# Patient Record
Sex: Male | Born: 1985 | Race: White | Hispanic: No | Marital: Single | Smoking: Current every day smoker
Health system: Southern US, Community
[De-identification: ages and names within clinical notes are randomized; demographics above are authoritative.]

---
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
## 2003-11-21 ENCOUNTER — Emergency Department (HOSPITAL_COMMUNITY): Admission: EM | Admit: 2003-11-21 | Discharge: 2003-11-21 | Payer: Self-pay | Admitting: Emergency Medicine

## 2007-01-06 ENCOUNTER — Emergency Department: Payer: Self-pay | Admitting: Emergency Medicine

## 2016-10-30 ENCOUNTER — Emergency Department (HOSPITAL_COMMUNITY)
Admission: EM | Admit: 2016-10-30 | Discharge: 2016-10-30 | Disposition: A | Discharge status: Home / Self Care | Payer: No Typology Code available for payment source | Attending: Emergency Medicine | Admitting: Emergency Medicine

## 2016-10-30 ENCOUNTER — Emergency Department (HOSPITAL_COMMUNITY): Payer: No Typology Code available for payment source

## 2016-10-30 ENCOUNTER — Encounter (HOSPITAL_COMMUNITY): Payer: Self-pay

## 2016-10-30 DIAGNOSIS — Y9389 Activity, other specified: Secondary | ICD-10-CM | POA: Diagnosis not present

## 2016-10-30 DIAGNOSIS — Y999 Unspecified external cause status: Secondary | ICD-10-CM | POA: Insufficient documentation

## 2016-10-30 DIAGNOSIS — Z23 Encounter for immunization: Secondary | ICD-10-CM | POA: Diagnosis not present

## 2016-10-30 DIAGNOSIS — Y92410 Unspecified street and highway as the place of occurrence of the external cause: Secondary | ICD-10-CM | POA: Insufficient documentation

## 2016-10-30 DIAGNOSIS — R55 Syncope and collapse: Secondary | ICD-10-CM | POA: Insufficient documentation

## 2016-10-30 DIAGNOSIS — S62612A Displaced fracture of proximal phalanx of right middle finger, initial encounter for closed fracture: Secondary | ICD-10-CM | POA: Diagnosis not present

## 2016-10-30 DIAGNOSIS — F17200 Nicotine dependence, unspecified, uncomplicated: Secondary | ICD-10-CM | POA: Diagnosis not present

## 2016-10-30 DIAGNOSIS — S0181XA Laceration without foreign body of other part of head, initial encounter: Secondary | ICD-10-CM | POA: Insufficient documentation

## 2016-10-30 DIAGNOSIS — S6991XA Unspecified injury of right wrist, hand and finger(s), initial encounter: Secondary | ICD-10-CM | POA: Diagnosis present

## 2016-10-30 LAB — COMPREHENSIVE METABOLIC PANEL
ALBUMIN: 4 g/dL (ref 3.5–5.0)
ALK PHOS: 59 U/L (ref 38–126)
ALT: 18 U/L (ref 17–63)
ANION GAP: 8 (ref 5–15)
AST: 28 U/L (ref 15–41)
BILIRUBIN TOTAL: 0.6 mg/dL (ref 0.3–1.2)
BUN: 7 mg/dL (ref 6–20)
CALCIUM: 9.2 mg/dL (ref 8.9–10.3)
CO2: 27 mmol/L (ref 22–32)
Chloride: 102 mmol/L (ref 101–111)
Creatinine, Ser: 0.64 mg/dL (ref 0.61–1.24)
GFR calc Af Amer: >60 mL/min (ref >60)
GFR calc non Af Amer: >60 mL/min (ref >60)
GLUCOSE: 111 mg/dL — AB (ref 65–99)
Potassium: 3.7 mmol/L (ref 3.5–5.1)
SODIUM: 137 mmol/L (ref 135–145)
Total Protein: 6.5 g/dL (ref 6.5–8.1)

## 2016-10-30 LAB — CBC WITH DIFFERENTIAL/PLATELET
BASOS ABS: 0 10*3/uL (ref 0.0–0.1)
BASOS PCT: 0 %
EOS ABS: 0.2 10*3/uL (ref 0.0–0.7)
Eosinophils Relative: 2 %
HEMATOCRIT: 39.8 % (ref 39.0–52.0)
HEMOGLOBIN: 13.9 g/dL (ref 13.0–17.0)
Lymphocytes Relative: 11 %
Lymphs Abs: 1.5 10*3/uL (ref 0.7–4.0)
MCH: 30.8 pg (ref 26.0–34.0)
MCHC: 34.9 g/dL (ref 30.0–36.0)
MCV: 88.1 fL (ref 78.0–100.0)
Monocytes Absolute: 1.1 10*3/uL — ABNORMAL HIGH (ref 0.1–1.0)
Monocytes Relative: 8 %
NEUTROS ABS: 11 10*3/uL — AB (ref 1.7–7.7)
NEUTROS PCT: 79 %
Platelets: 218 10*3/uL (ref 150–400)
RBC: 4.52 MIL/uL (ref 4.22–5.81)
RDW: 12.6 % (ref 11.5–15.5)
WBC: 13.8 10*3/uL — AB (ref 4.0–10.5)

## 2016-10-30 LAB — ETHANOL: Alcohol, Ethyl (B): <5 mg/dL (ref <5)

## 2016-10-30 MED ORDER — LIDOCAINE-EPINEPHRINE (PF) 2 %-1:200000 IJ SOLN
20.0000 mL | Freq: Once | INTRAMUSCULAR | Status: AC
  Administered 2016-10-30: 20 mL via INTRADERMAL
  Filled 2016-10-30: qty 20

## 2016-10-30 MED ORDER — SODIUM CHLORIDE 0.9 % IV BOLUS (SEPSIS)
1000.0000 mL | Freq: Once | INTRAVENOUS | Status: AC
  Administered 2016-10-30: 1000 mL via INTRAVENOUS

## 2016-10-30 MED ORDER — TETANUS-DIPHTH-ACELL PERTUSSIS 5-2.5-18.5 LF-MCG/0.5 IM SUSP
0.5000 mL | Freq: Once | INTRAMUSCULAR | Status: AC
  Administered 2016-10-30: 0.5 mL via INTRAMUSCULAR
  Filled 2016-10-30: qty 0.5

## 2016-10-30 NOTE — ED Notes (Signed)
Pt more awake at this time. Able to ambulate to restroom. Has phoned a friend to transport home.
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
## 2016-10-30 NOTE — Progress Notes (Signed)
Orthopedic Tech Progress Note Patient Details:  Steven Dennis 1986/06/19 [id number]  Ortho Devices Type of Ortho Device: Finger splint Ortho Device/Splint Location: applied finger splint to pt right hand middle finger.  pt tolerated well.  Right hand Middle finger.  Ortho Device/Splint Interventions: Application   Alvina Chou 10/30/2016, 4:22 PM

## 2016-10-30 NOTE — ED Notes (Signed)
Pt voices understanding of discharge instructions. Ambulatory to waiting room to wait for transportation.
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
## 2016-10-30 NOTE — Discharge Instructions (Signed)
Please return with concerns./

## 2016-10-31 ENCOUNTER — Telehealth: Payer: Self-pay | Admitting: *Deleted
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
## 2023-03-11 ENCOUNTER — Other Ambulatory Visit: Payer: Self-pay

## 2023-03-11 ENCOUNTER — Encounter (HOSPITAL_COMMUNITY): Payer: Self-pay

## 2023-03-11 ENCOUNTER — Emergency Department (HOSPITAL_COMMUNITY): Payer: Managed Care, Other (non HMO)

## 2023-03-11 ENCOUNTER — Inpatient Hospital Stay (HOSPITAL_COMMUNITY)
Admission: EM | Admit: 2023-03-11 | Discharge: 2023-03-12 | DRG: 563 | Disposition: A | Discharge status: Home / Self Care | Payer: Managed Care, Other (non HMO) | Attending: General Surgery | Admitting: General Surgery

## 2023-03-11 DIAGNOSIS — F17200 Nicotine dependence, unspecified, uncomplicated: Secondary | ICD-10-CM | POA: Diagnosis present

## 2023-03-11 DIAGNOSIS — S40811A Abrasion of right upper arm, initial encounter: Secondary | ICD-10-CM | POA: Diagnosis present

## 2023-03-11 DIAGNOSIS — S0083XA Contusion of other part of head, initial encounter: Secondary | ICD-10-CM | POA: Diagnosis present

## 2023-03-11 DIAGNOSIS — S069X0A Unspecified intracranial injury without loss of consciousness, initial encounter: Secondary | ICD-10-CM

## 2023-03-11 DIAGNOSIS — Y902 Blood alcohol level of 40-59 mg/100 ml: Secondary | ICD-10-CM | POA: Diagnosis present

## 2023-03-11 DIAGNOSIS — S50311A Abrasion of right elbow, initial encounter: Secondary | ICD-10-CM | POA: Diagnosis present

## 2023-03-11 DIAGNOSIS — S069XAA Unspecified intracranial injury with loss of consciousness status unknown, initial encounter: Secondary | ICD-10-CM | POA: Diagnosis present

## 2023-03-11 DIAGNOSIS — F10129 Alcohol abuse with intoxication, unspecified: Secondary | ICD-10-CM | POA: Diagnosis present

## 2023-03-11 DIAGNOSIS — Z653 Problems related to other legal circumstances: Secondary | ICD-10-CM

## 2023-03-11 DIAGNOSIS — S92341A Displaced fracture of fourth metatarsal bone, right foot, initial encounter for closed fracture: Secondary | ICD-10-CM | POA: Diagnosis present

## 2023-03-11 DIAGNOSIS — S060XAA Concussion with loss of consciousness status unknown, initial encounter: Principal | ICD-10-CM | POA: Diagnosis present

## 2023-03-11 DIAGNOSIS — S92301A Fracture of unspecified metatarsal bone(s), right foot, initial encounter for closed fracture: Secondary | ICD-10-CM

## 2023-03-11 DIAGNOSIS — D72829 Elevated white blood cell count, unspecified: Secondary | ICD-10-CM | POA: Diagnosis present

## 2023-03-11 DIAGNOSIS — Y92410 Unspecified street and highway as the place of occurrence of the external cause: Secondary | ICD-10-CM

## 2023-03-11 DIAGNOSIS — S0003XA Contusion of scalp, initial encounter: Secondary | ICD-10-CM | POA: Diagnosis present

## 2023-03-11 DIAGNOSIS — S92331A Displaced fracture of third metatarsal bone, right foot, initial encounter for closed fracture: Secondary | ICD-10-CM | POA: Diagnosis present

## 2023-03-11 DIAGNOSIS — M79651 Pain in right thigh: Secondary | ICD-10-CM | POA: Diagnosis present

## 2023-03-11 DIAGNOSIS — S60511A Abrasion of right hand, initial encounter: Secondary | ICD-10-CM | POA: Diagnosis present

## 2023-03-11 DIAGNOSIS — S92321A Displaced fracture of second metatarsal bone, right foot, initial encounter for closed fracture: Principal | ICD-10-CM | POA: Diagnosis present

## 2023-03-11 MED ORDER — SODIUM CHLORIDE 0.9 % IV SOLN: 250.0000 mL | INTRAVENOUS | Status: DC | PRN

## 2023-03-11 MED ORDER — SODIUM CHLORIDE 0.9% FLUSH: 3.0000 mL | INTRAVENOUS | Status: DC | PRN

## 2023-03-11 MED ORDER — SODIUM CHLORIDE 0.9% FLUSH
3.0000 mL | Freq: Two times a day (BID) | INTRAVENOUS | Status: DC
  Administered 2023-03-12: 3 mL via INTRAVENOUS

## 2023-03-11 NOTE — ED Notes (Signed)
To CT
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
## 2023-03-12 ENCOUNTER — Emergency Department (HOSPITAL_COMMUNITY): Payer: Managed Care, Other (non HMO)

## 2023-03-12 ENCOUNTER — Inpatient Hospital Stay (HOSPITAL_COMMUNITY): Payer: Managed Care, Other (non HMO)

## 2023-03-12 DIAGNOSIS — S069XAA Unspecified intracranial injury with loss of consciousness status unknown, initial encounter: Secondary | ICD-10-CM | POA: Diagnosis present

## 2023-03-12 LAB — URINALYSIS, ROUTINE W REFLEX MICROSCOPIC
Bacteria, UA: NONE SEEN
Bilirubin Urine: NEGATIVE
Glucose, UA: NEGATIVE mg/dL
Ketones, ur: NEGATIVE mg/dL
Leukocytes,Ua: NEGATIVE
Nitrite: NEGATIVE
Protein, ur: NEGATIVE mg/dL
Specific Gravity, Urine: 1.044 — ABNORMAL HIGH (ref 1.005–1.030)
pH: 5 (ref 5.0–8.0)

## 2023-03-12 LAB — COMPREHENSIVE METABOLIC PANEL
ALT: 24 U/L (ref 0–44)
AST: 42 U/L — ABNORMAL HIGH (ref 15–41)
Albumin: 4 g/dL (ref 3.5–5.0)
Alkaline Phosphatase: 59 U/L (ref 38–126)
Anion gap: 11 (ref 5–15)
BUN: 12 mg/dL (ref 6–20)
CO2: 23 mmol/L (ref 22–32)
Calcium: 8.8 mg/dL — ABNORMAL LOW (ref 8.9–10.3)
Chloride: 102 mmol/L (ref 98–111)
Creatinine, Ser: 0.78 mg/dL (ref 0.61–1.24)
GFR, Estimated: >60 mL/min (ref >60)
Glucose, Bld: 105 mg/dL — ABNORMAL HIGH (ref 70–99)
Potassium: 3.4 mmol/L — ABNORMAL LOW (ref 3.5–5.1)
Sodium: 136 mmol/L (ref 135–145)
Total Bilirubin: 0.8 mg/dL (ref 0.3–1.2)
Total Protein: 6.7 g/dL (ref 6.5–8.1)

## 2023-03-12 LAB — I-STAT CHEM 8, ED
BUN: 11 mg/dL (ref 6–20)
Calcium, Ion: 1.14 mmol/L — ABNORMAL LOW (ref 1.15–1.40)
Chloride: 101 mmol/L (ref 98–111)
Creatinine, Ser: 0.6 mg/dL — ABNORMAL LOW (ref 0.61–1.24)
Glucose, Bld: 98 mg/dL (ref 70–99)
HCT: 39 % (ref 39.0–52.0)
Hemoglobin: 13.3 g/dL (ref 13.0–17.0)
Potassium: 3.4 mmol/L — ABNORMAL LOW (ref 3.5–5.1)
Sodium: 136 mmol/L (ref 135–145)
TCO2: 23 mmol/L (ref 22–32)

## 2023-03-12 LAB — CBC
HCT: 41 % (ref 39.0–52.0)
Hemoglobin: 13.9 g/dL (ref 13.0–17.0)
MCH: 30.4 pg (ref 26.0–34.0)
MCHC: 33.9 g/dL (ref 30.0–36.0)
MCV: 89.7 fL (ref 80.0–100.0)
Platelets: 252 10*3/uL (ref 150–400)
RBC: 4.57 MIL/uL (ref 4.22–5.81)
RDW: 12.3 % (ref 11.5–15.5)
WBC: 14.9 10*3/uL — ABNORMAL HIGH (ref 4.0–10.5)
nRBC: 0 % (ref 0.0–0.2)

## 2023-03-12 LAB — RAPID URINE DRUG SCREEN, HOSP PERFORMED
Amphetamines: POSITIVE — AB
Barbiturates: NOT DETECTED
Benzodiazepines: NOT DETECTED
Cocaine: NOT DETECTED
Opiates: NOT DETECTED
Tetrahydrocannabinol: POSITIVE — AB

## 2023-03-12 LAB — SAMPLE TO BLOOD BANK

## 2023-03-12 LAB — CK: Total CK: 846 U/L — ABNORMAL HIGH (ref 49–397)

## 2023-03-12 LAB — CBG MONITORING, ED: Glucose-Capillary: 98 mg/dL (ref 70–99)

## 2023-03-12 LAB — PROTIME-INR
INR: 1 (ref 0.8–1.2)
Prothrombin Time: 13.6 s (ref 11.4–15.2)

## 2023-03-12 LAB — I-STAT CG4 LACTIC ACID, ED: Lactic Acid, Venous: 1.5 mmol/L (ref 0.5–1.9)

## 2023-03-12 LAB — ETHANOL: Alcohol, Ethyl (B): 51 mg/dL — ABNORMAL HIGH (ref <10)

## 2023-03-12 LAB — HIV ANTIBODY (ROUTINE TESTING W REFLEX): HIV Screen 4th Generation wRfx: NONREACTIVE

## 2023-03-12 MED ORDER — LACTATED RINGERS IV SOLN: INTRAVENOUS | Status: DC

## 2023-03-12 MED ORDER — MORPHINE SULFATE (PF) 4 MG/ML IV SOLN: 4.0000 mg | INTRAVENOUS | Status: DC | PRN

## 2023-03-12 MED ORDER — ENOXAPARIN SODIUM 30 MG/0.3ML IJ SOSY: 30.0000 mg | PREFILLED_SYRINGE | Freq: Two times a day (BID) | INTRAMUSCULAR | Status: DC

## 2023-03-12 MED ORDER — POLYETHYLENE GLYCOL 3350 17 G PO PACK: 17.0000 g | PACK | Freq: Every day | ORAL | Status: DC | PRN

## 2023-03-12 MED ORDER — OXYCODONE HCL 5 MG PO TABS: 5.0000 mg | ORAL_TABLET | ORAL | Status: DC | PRN

## 2023-03-12 MED ORDER — ONDANSETRON HCL 4 MG/2ML IJ SOLN: 4.0000 mg | Freq: Four times a day (QID) | INTRAMUSCULAR | Status: DC | PRN

## 2023-03-12 MED ORDER — DOCUSATE SODIUM 100 MG PO CAPS
100.0000 mg | ORAL_CAPSULE | Freq: Two times a day (BID) | ORAL | Status: DC
  Administered 2023-03-12: 100 mg via ORAL
  Filled 2023-03-12: qty 1

## 2023-03-12 MED ORDER — OXYCODONE HCL 5 MG PO TABS: 10.0000 mg | ORAL_TABLET | ORAL | Status: DC | PRN

## 2023-03-12 MED ORDER — ONDANSETRON HCL 4 MG/2ML IJ SOLN
4.0000 mg | Freq: Once | INTRAMUSCULAR | Status: AC
  Administered 2023-03-12: 4 mg via INTRAVENOUS
  Filled 2023-03-12: qty 2

## 2023-03-12 MED ORDER — ACETAMINOPHEN 500 MG PO TABS: 1000.0000 mg | ORAL_TABLET | Freq: Four times a day (QID) | ORAL | 0 refills | Status: AC

## 2023-03-12 MED ORDER — ONDANSETRON 4 MG PO TBDP: 4.0000 mg | ORAL_TABLET | Freq: Four times a day (QID) | ORAL | Status: DC | PRN

## 2023-03-12 MED ORDER — ACETAMINOPHEN 500 MG PO TABS
1000.0000 mg | ORAL_TABLET | Freq: Four times a day (QID) | ORAL | Status: DC
  Administered 2023-03-12 (×2): 1000 mg via ORAL
  Filled 2023-03-12 (×4): qty 2

## 2023-03-12 MED ORDER — HYDRALAZINE HCL 20 MG/ML IJ SOLN: 10.0000 mg | INTRAMUSCULAR | Status: DC | PRN

## 2023-03-12 MED ORDER — SODIUM CHLORIDE 0.9 % IV BOLUS
1000.0000 mL | Freq: Once | INTRAVENOUS | Status: AC
  Administered 2023-03-12: 1000 mL via INTRAVENOUS

## 2023-03-12 MED ORDER — IOHEXOL 350 MG/ML SOLN
75.0000 mL | Freq: Once | INTRAVENOUS | Status: AC | PRN
  Administered 2023-03-12: 75 mL via INTRAVENOUS

## 2023-03-12 MED ORDER — DOCUSATE SODIUM 100 MG PO CAPS: 100.0000 mg | ORAL_CAPSULE | Freq: Two times a day (BID) | ORAL | 0 refills | Status: AC

## 2023-03-12 MED ORDER — METOPROLOL TARTRATE 5 MG/5ML IV SOLN: 5.0000 mg | Freq: Four times a day (QID) | INTRAVENOUS | Status: DC | PRN

## 2023-03-12 MED ORDER — METHOCARBAMOL 1000 MG/10ML IJ SOLN
500.0000 mg | Freq: Three times a day (TID) | INTRAVENOUS | Status: DC
  Administered 2023-03-12: 500 mg via INTRAVENOUS
  Filled 2023-03-12: qty 5

## 2023-03-12 MED ORDER — METHOCARBAMOL 500 MG PO TABS
500.0000 mg | ORAL_TABLET | Freq: Three times a day (TID) | ORAL | Status: DC
  Administered 2023-03-12: 500 mg via ORAL
  Filled 2023-03-12 (×2): qty 1

## 2023-03-12 MED ORDER — METHOCARBAMOL 500 MG PO TABS: 500.0000 mg | ORAL_TABLET | Freq: Three times a day (TID) | ORAL | 0 refills | Status: AC | PRN
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
## 2023-03-12 NOTE — Progress Notes (Signed)
SLP Cancellation Note  Patient Details Name: Steven Dennis MRN: [id number] DOB: 11/27/1985   Cancelled treatment:       Reason Eval/Treat Not Completed: SLP screened - chart reviewed and pt was discussed with OT. Pt tested WNL with OT and wife reported to OT that pt's cognition is close to baseline. SLP agrees with f/u at concussion clinic upon discharge. Wife is already aware of recommendation to f/u there and has been educated about post-concussive symptoms.      Mahala Menghini., M.A. CCC-SLP Acute Rehabilitation Services Office [phone]  Secure chat preferred  03/12/2023, 3:58 PM

## 2023-03-12 NOTE — ED Notes (Signed)
Pt removed CAM boot
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
## 2023-03-12 NOTE — Plan of Care (Signed)
  Problem: Education: Goal: Knowledge of General Education information will improve Description: Including pain rating scale, medication(s)/side effects and non-pharmacologic comfort measures 03/12/2023 1705 by Kelli Hope, RN Outcome: Adequate for Discharge 03/12/2023 1704 by Kelli Hope, RN Outcome: Adequate for Discharge   Problem: Health Behavior/Discharge Planning: Goal: Ability to manage health-related needs will improve 03/12/2023 1705 by Kelli Hope, RN Outcome: Adequate for Discharge 03/12/2023 1704 by Kelli Hope, RN Outcome: Adequate for Discharge   Problem: Clinical Measurements: Goal: Ability to maintain clinical measurements within normal limits will improve 03/12/2023 1705 by Kelli Hope, RN Outcome: Adequate for Discharge 03/12/2023 1704 by Kelli Hope, RN Outcome: Adequate for Discharge Goal: Will remain free from infection 03/12/2023 1705 by Kelli Hope, RN Outcome: Adequate for Discharge 03/12/2023 1704 by Kelli Hope, RN Outcome: Adequate for Discharge Goal: Diagnostic test results will improve 03/12/2023 1705 by Kelli Hope, RN Outcome: Adequate for Discharge 03/12/2023 1704 by Kelli Hope, RN Outcome: Adequate for Discharge Goal: Respiratory complications will improve 03/12/2023 1705 by Kelli Hope, RN Outcome: Adequate for Discharge 03/12/2023 1704 by Kelli Hope, RN Outcome: Adequate for Discharge Goal: Cardiovascular complication will be avoided 03/12/2023 1705 by Kelli Hope, RN Outcome: Adequate for Discharge 03/12/2023 1704 by Kelli Hope, RN Outcome: Adequate for Discharge   Problem: Activity: Goal: Risk for activity intolerance will decrease 03/12/2023 1705 by Kelli Hope, RN Outcome: Adequate for Discharge 03/12/2023 1704 by Kelli Hope, RN Outcome: Adequate for Discharge   Problem: Nutrition: Goal: Adequate nutrition will be maintained 03/12/2023 1705 by Kelli Hope,  RN Outcome: Adequate for Discharge 03/12/2023 1704 by Kelli Hope, RN Outcome: Adequate for Discharge   Problem: Coping: Goal: Level of anxiety will decrease 03/12/2023 1705 by Kelli Hope, RN Outcome: Adequate for Discharge 03/12/2023 1704 by Kelli Hope, RN Outcome: Adequate for Discharge   Problem: Elimination: Goal: Will not experience complications related to bowel motility 03/12/2023 1705 by Kelli Hope, RN Outcome: Adequate for Discharge 03/12/2023 1704 by Kelli Hope, RN Outcome: Adequate for Discharge Goal: Will not experience complications related to urinary retention 03/12/2023 1705 by Kelli Hope, RN Outcome: Adequate for Discharge 03/12/2023 1704 by Kelli Hope, RN Outcome: Adequate for Discharge   Problem: Pain Managment: Goal: General experience of comfort will improve 03/12/2023 1705 by Kelli Hope, RN Outcome: Adequate for Discharge 03/12/2023 1704 by Kelli Hope, RN Outcome: Adequate for Discharge   Problem: Safety: Goal: Ability to remain free from injury will improve 03/12/2023 1705 by Kelli Hope, RN Outcome: Adequate for Discharge 03/12/2023 1704 by Kelli Hope, RN Outcome: Adequate for Discharge   Problem: Skin Integrity: Goal: Risk for impaired skin integrity will decrease 03/12/2023 1705 by Kelli Hope, RN Outcome: Adequate for Discharge 03/12/2023 1704 by Kelli Hope, RN Outcome: Adequate for Discharge   Kelli Hope, RN

## 2023-03-12 NOTE — Discharge Instructions (Signed)
                  Intensive Outpatient Programs  [facility]    [facility] [address]     [address] [place],  [place]     [place], [place] [phone]      [phone]  [facility]   [facility]  (Inpatient and outpatient)  [phone] (Suboxone and Methadone) [address]           [phone]           [facility]    [facility] - Intensive Outpatient [address]     [address] [place], [place] [address]     [place], [place]  [phone]      [phone]  [facility] (Outpatient, Inpatient, [facility]  [facility] (Groups and Residental) (insurance only) [phone]    [place], [place]          [phone]       [facility]    [facility] (for caregivers and family) [address]     [address] [place], [place]     [place], [place] [phone]      [phone]  Residential Treatment Programs  [facility]  [facility](2 years) Residential: 90 days)  [facility] [address]      [address] [place], [place]     [place], [place] [phone]      [phone] or [phone]  [facility]    [facility] [address]      [address] [place], [place]     [place], [place] [phone]      [phone]  [facility]   [facility] ([facility] [address]     [address] [place], [place] [address]     [place], [place] [phone]      [phone] Admissions: 8am-3pm M-F  [facility]: Residential Program (90 Days)              [facility]: [facility]  [place], [place]     [place],   [phone] or [phone]    (Walk in Hours over the weekend or by referral)   [facility]: [facility]:[phone] (for crisis  response 24 hours a day) 

## 2023-03-12 NOTE — ED Notes (Signed)
Patient is resting in bed. Warm blanket given to patient.

## 2023-03-12 NOTE — ED Notes (Signed)
Cam boot placed on pt right leg per MD order

## 2023-05-25 ENCOUNTER — Other Ambulatory Visit: Payer: Self-pay | Admitting: Neurology

## 2023-05-25 DIAGNOSIS — S060X9D Concussion with loss of consciousness of unspecified duration, subsequent encounter: Secondary | ICD-10-CM

## 2023-06-19 ENCOUNTER — Other Ambulatory Visit: Payer: Self-pay
